# Patient Record
Sex: Male | Born: 2000 | Race: White | Hispanic: No | Marital: Single | Smoking: Never smoker
Health system: Southern US, Community
[De-identification: ages and names within clinical notes are randomized; demographics above are authoritative.]

---
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
## 2020-01-05 IMAGING — DX DG ABDOMEN ACUTE W/ 1V CHEST
3 series · 3 of 3 positions shown · non-contrast
Comparison: None.

CLINICAL DATA: 17-year-old male with abdominal pain.

EXAM:
DG ABDOMEN ACUTE W/ 1V CHEST

[abdomen kub]
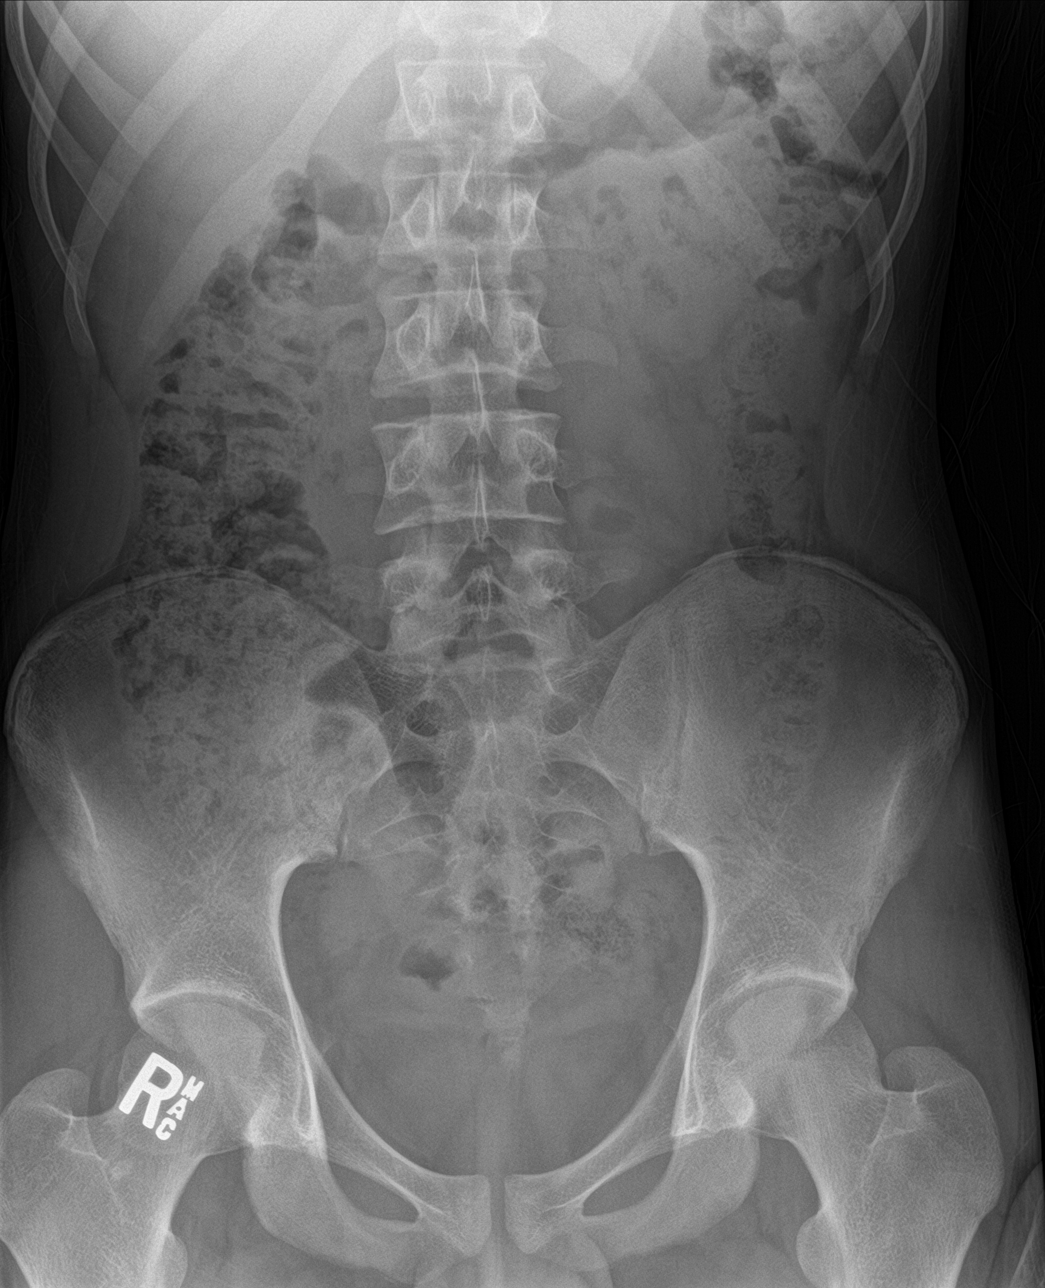

[abdomen erect]
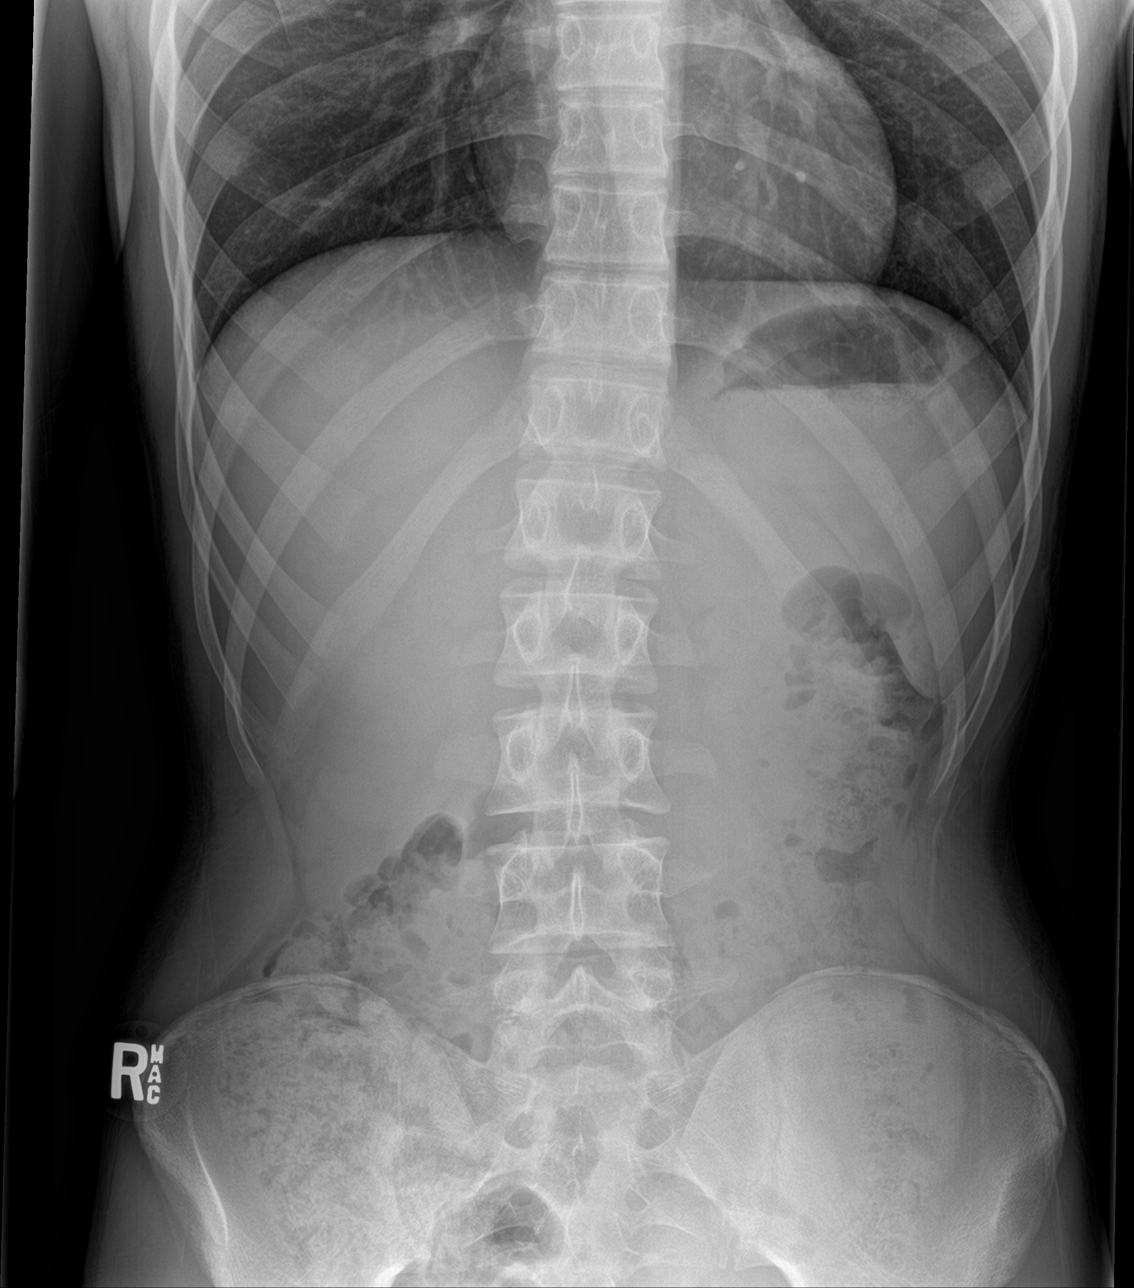

[chest pa]
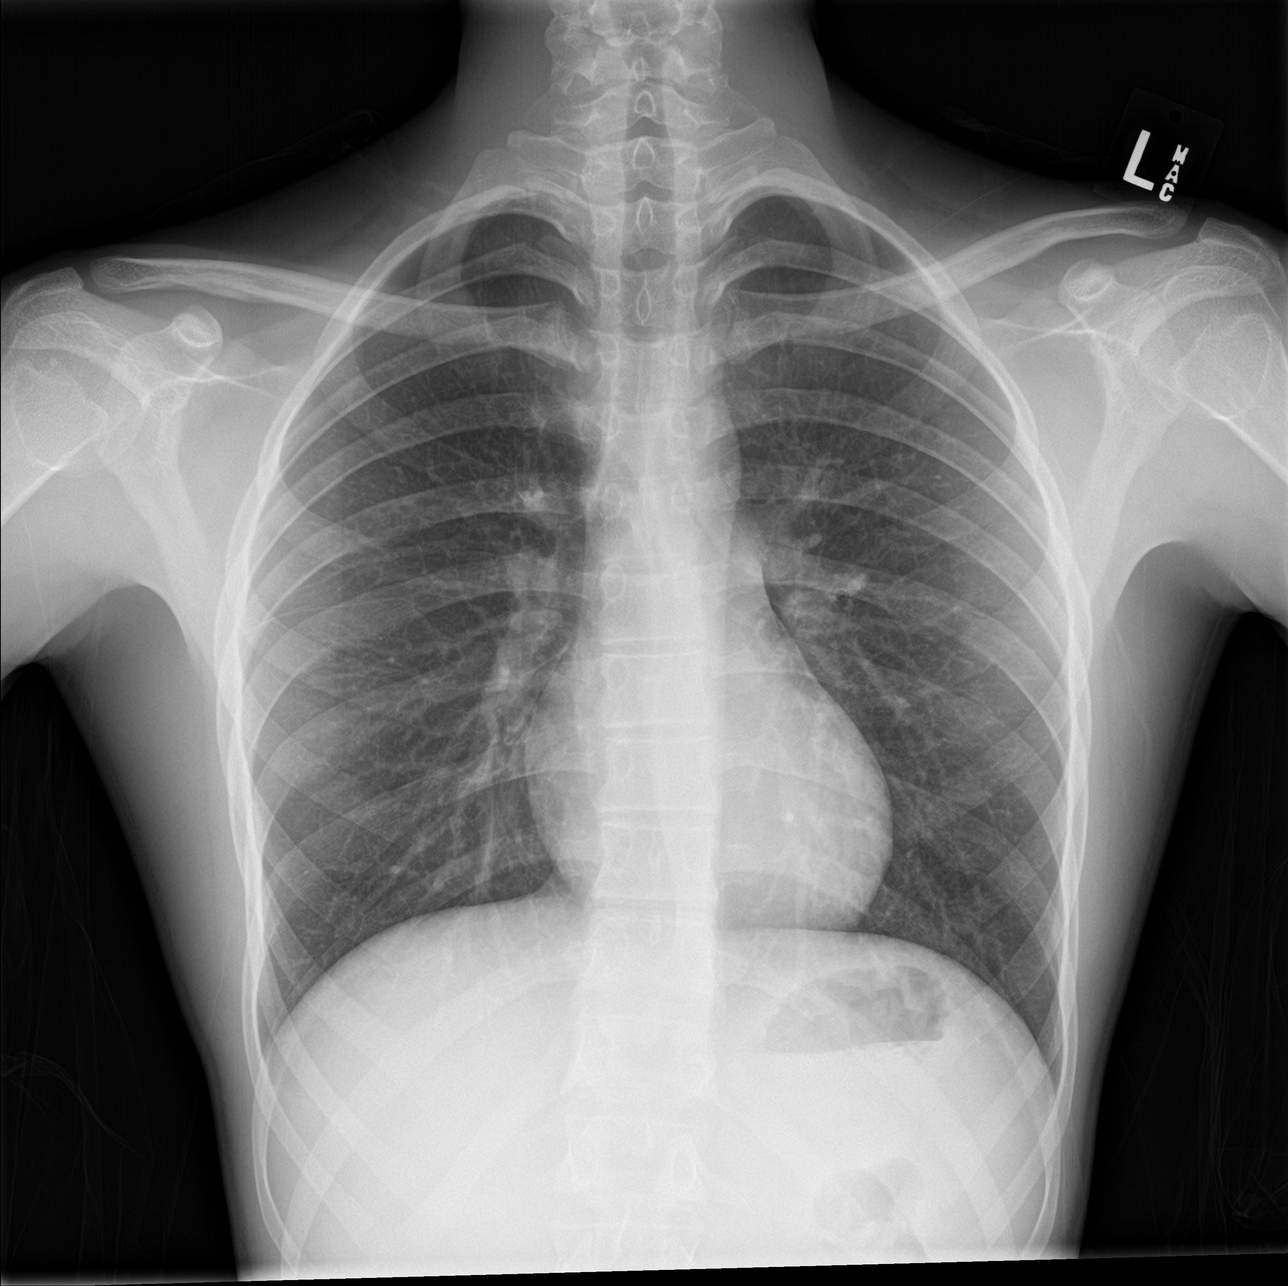

[3 of 3 positions shown; findings below may reference images not displayed]

FINDINGS: The lungs are clear. There is no pleural effusion or pneumothorax.
The cardiac silhouette is within normal limits.

There is moderate stool throughout the colon. No bowel dilatation or
evidence of obstruction. No free air or radiopaque calculi. The
osseous structures and soft tissues are grossly unremarkable.
IMPRESSION: 1. No acute cardiopulmonary process.
2. Moderate colonic stool burden.  No evidence of bowel obstruction.
